# Patient Record
Sex: Male | Born: 1990 | Race: White | Hispanic: No | Marital: Married | Smoking: Current every day smoker
Health system: Southern US, Community
[De-identification: ages and names within clinical notes are randomized; demographics above are authoritative.]

---

## 2016-07-16 ENCOUNTER — Encounter: Payer: Self-pay | Admitting: Emergency Medicine

## 2016-07-16 ENCOUNTER — Emergency Department (INDEPENDENT_AMBULATORY_CARE_PROVIDER_SITE_OTHER)
Admission: EM | Admit: 2016-07-16 | Discharge: 2016-07-16 | Disposition: A | Discharge status: Home / Self Care | Source: Home / Self Care | Attending: Family Medicine | Admitting: Family Medicine

## 2016-07-16 ENCOUNTER — Other Ambulatory Visit: Payer: Self-pay

## 2016-07-16 ENCOUNTER — Emergency Department (INDEPENDENT_AMBULATORY_CARE_PROVIDER_SITE_OTHER)

## 2016-07-16 DIAGNOSIS — R079 Chest pain, unspecified: Secondary | ICD-10-CM

## 2016-07-16 DIAGNOSIS — R0602 Shortness of breath: Secondary | ICD-10-CM

## 2016-07-16 MED ORDER — PREDNISONE 20 MG PO TABS: ORAL_TABLET | ORAL | 0 refills | Status: AC

## 2016-07-16 MED ORDER — NAPROXEN 375 MG PO TABS: 375.0000 mg | ORAL_TABLET | Freq: Two times a day (BID) | ORAL | 0 refills | Status: AC

## 2016-07-16 NOTE — ED Provider Notes (Signed)
CSN: [id number]     Arrival date & time 07/16/16  1641 History   First MD Initiated Contact with Patient 07/16/16 1728     Chief Complaint  Patient presents with  . Chest Pain   (Consider location/radiation/quality/duration/timing/severity/associated sxs/prior Treatment) HPI  Allen Barnett is a 26 y.o. male presenting to UC with c/o centralized chest pressure and soreness for about 1 week. Mild intermittent SOB and lightheadedness. Intermittent heart burn, cough and wheeze for years per pt. Pt is a daily cigarette smoker.  Reports hx of exercise induced asthma as a child but notes he was accepted into the [employer] and did well.  Current pain is 2/10.  He takes Meloxicam routinely for muscle/joint pain but has not noticed a different in the chest pressure. Denies getting better or worse with lying down or eating. Denies nausea or vomiting. No abdominal pain.   History reviewed. No pertinent past medical history. History reviewed. No pertinent surgical history. No family history on file. Social History  Substance Use Topics  . Smoking status: Current Every Day Smoker    Packs/day: 1.00    Years: 15.00    Types: Cigarettes  . Smokeless tobacco: Never Used  . Alcohol use Not on file    Review of Systems  Constitutional: Negative for chills and fever.  HENT: Negative for congestion, ear pain, sore throat, trouble swallowing and voice change.   Respiratory: Positive for cough, shortness of breath and wheezing.   Cardiovascular: Positive for chest pain. Negative for palpitations.  Gastrointestinal: Negative for abdominal pain, diarrhea, nausea and vomiting.  Musculoskeletal: Negative for arthralgias, back pain and myalgias.  Skin: Negative for rash.    Allergies  Patient has no known allergies.  Home Medications   Prior to Admission medications   Medication Sig Start Date End Date Taking? Authorizing Provider  meloxicam (MOBIC) 15 MG tablet Take 15 mg by mouth daily.   Yes  Historical Provider, MD  naproxen (NAPROSYN) 375 MG tablet Take 1 tablet (375 mg total) by mouth 2 (two) times daily. 07/16/16   Junius Finner, PA-C  predniSONE (DELTASONE) 20 MG tablet 3 tabs po day one, then 2 po daily x 4 days 07/16/16   Junius Finner, PA-C   Meds Ordered and Administered this Visit  Medications - No data to display  BP 135/83 (BP Location: Left Arm)   Pulse 72   Temp 98.1 F (36.7 C) (Oral)   Ht 6' (1.829 m)   Wt 258 lb (117 kg)   SpO2 100%   BMI 34.99 kg/m  No data found.   Physical Exam  Constitutional: He is oriented to person, place, and time. He appears well-developed and well-nourished. No distress.  HENT:  Head: Normocephalic and atraumatic.  Nose: Nose normal.  Mouth/Throat: Uvula is midline, oropharynx is clear and moist and mucous membranes are normal.  Eyes: EOM are normal.  Neck: Normal range of motion. Neck supple.  Cardiovascular: Normal rate and regular rhythm.   Pulmonary/Chest: Effort normal and breath sounds normal. No respiratory distress. He has no wheezes. He has no rales. He exhibits no tenderness.  Musculoskeletal: Normal range of motion.  Neurological: He is alert and oriented to person, place, and time.  Skin: Skin is warm and dry. He is not diaphoretic.  Psychiatric: He has a normal mood and affect. His behavior is normal.  Nursing note and vitals reviewed.   Urgent Care Course     Procedures (including critical care time)  Labs Review Labs Reviewed - No  data to display  Imaging Review Dg Chest 2 View  Result Date: 07/16/2016 CLINICAL DATA:  Shortness of breath with chest tightness for 1 week EXAM: CHEST  2 VIEW COMPARISON:  None. FINDINGS: The heart size and mediastinal contours are within normal limits. Both lungs are clear. The visualized skeletal structures show degenerative change of the thoracic spine. IMPRESSION: No active cardiopulmonary disease. Electronically Signed   By: Alcide CleverMark  Lukens M.D.   On: 07/16/2016 17:47     Date/Time:07/16/2016     16:53:28 Ventricular Rate: 64 PR Interval: 130 QRS Duration: 92 QT Interval: 418 QTC Calculation: 431 P-R-T axes: -15   60   38 Text Interpretation: Normal sinus rhythm, Normal EKG    MDM   1. Chest pain, unspecified type    Pt c/o chest pain and pressure for 1 week. O2 Sat 100% on RA EKG and CXR: Normal  Reassured pt of normal workup.  Pain could be due to costochondritis vs mild GERD May try OTC Prilosec Rx: Prednisone and Naproxen (instead of Meloxicam) for possible costochondritis   f/u with PCP/Sports Medicine in 1 week if not improving.     Junius Finnerrin O'Malley, PA-C 07/16/16 1849

## 2016-07-16 NOTE — ED Triage Notes (Signed)
Chest pressure, center of chest, SOB, light headed x 1 week. Heartburn, cough, wheezing (for years)

## 2017-07-18 IMAGING — DX DG CHEST 2V
2 series · 2 of 2 positions shown · non-contrast
Comparison: None.

CLINICAL DATA: Shortness of breath with chest tightness for 1 week

EXAM:
CHEST  2 VIEW

[chest pa]
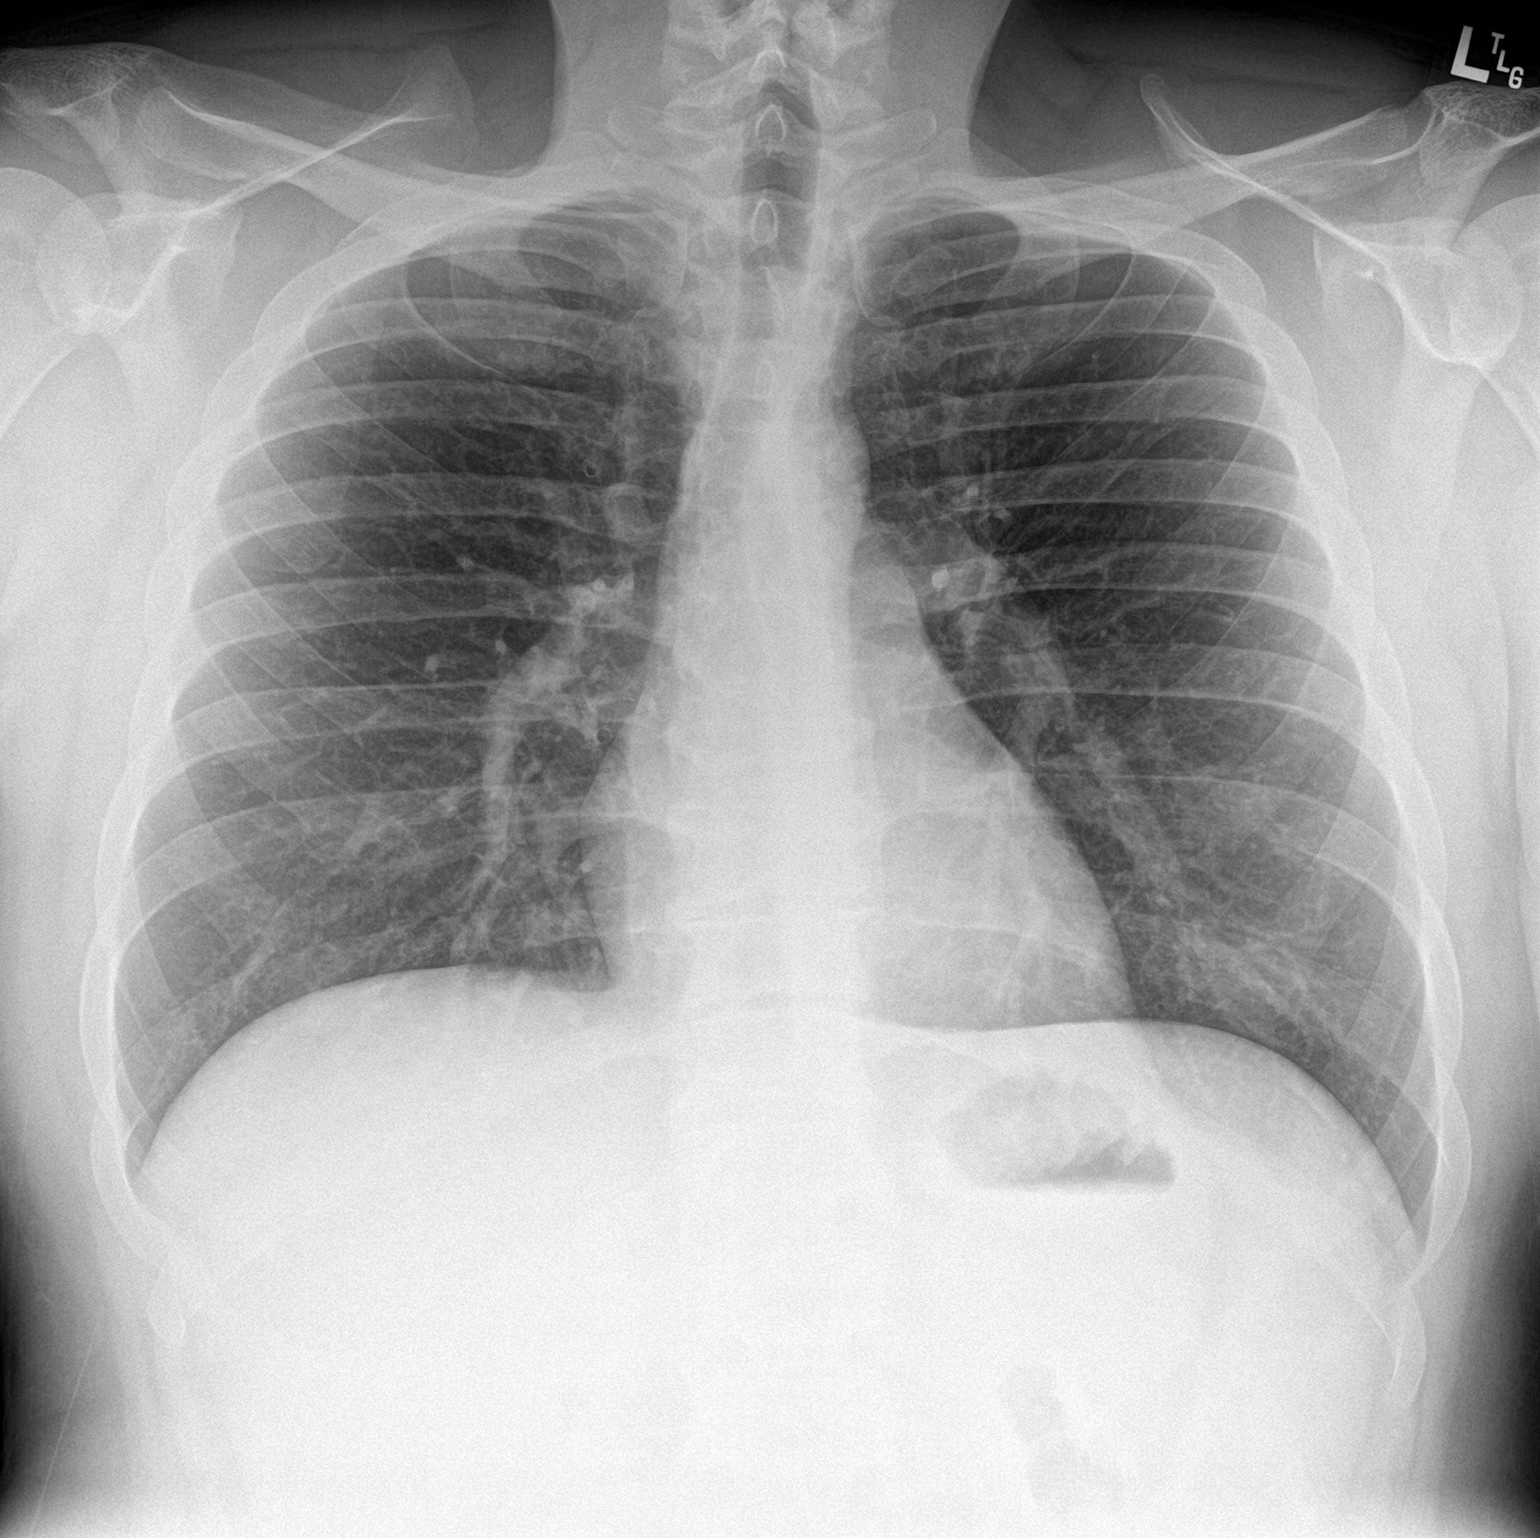

[chest lat]
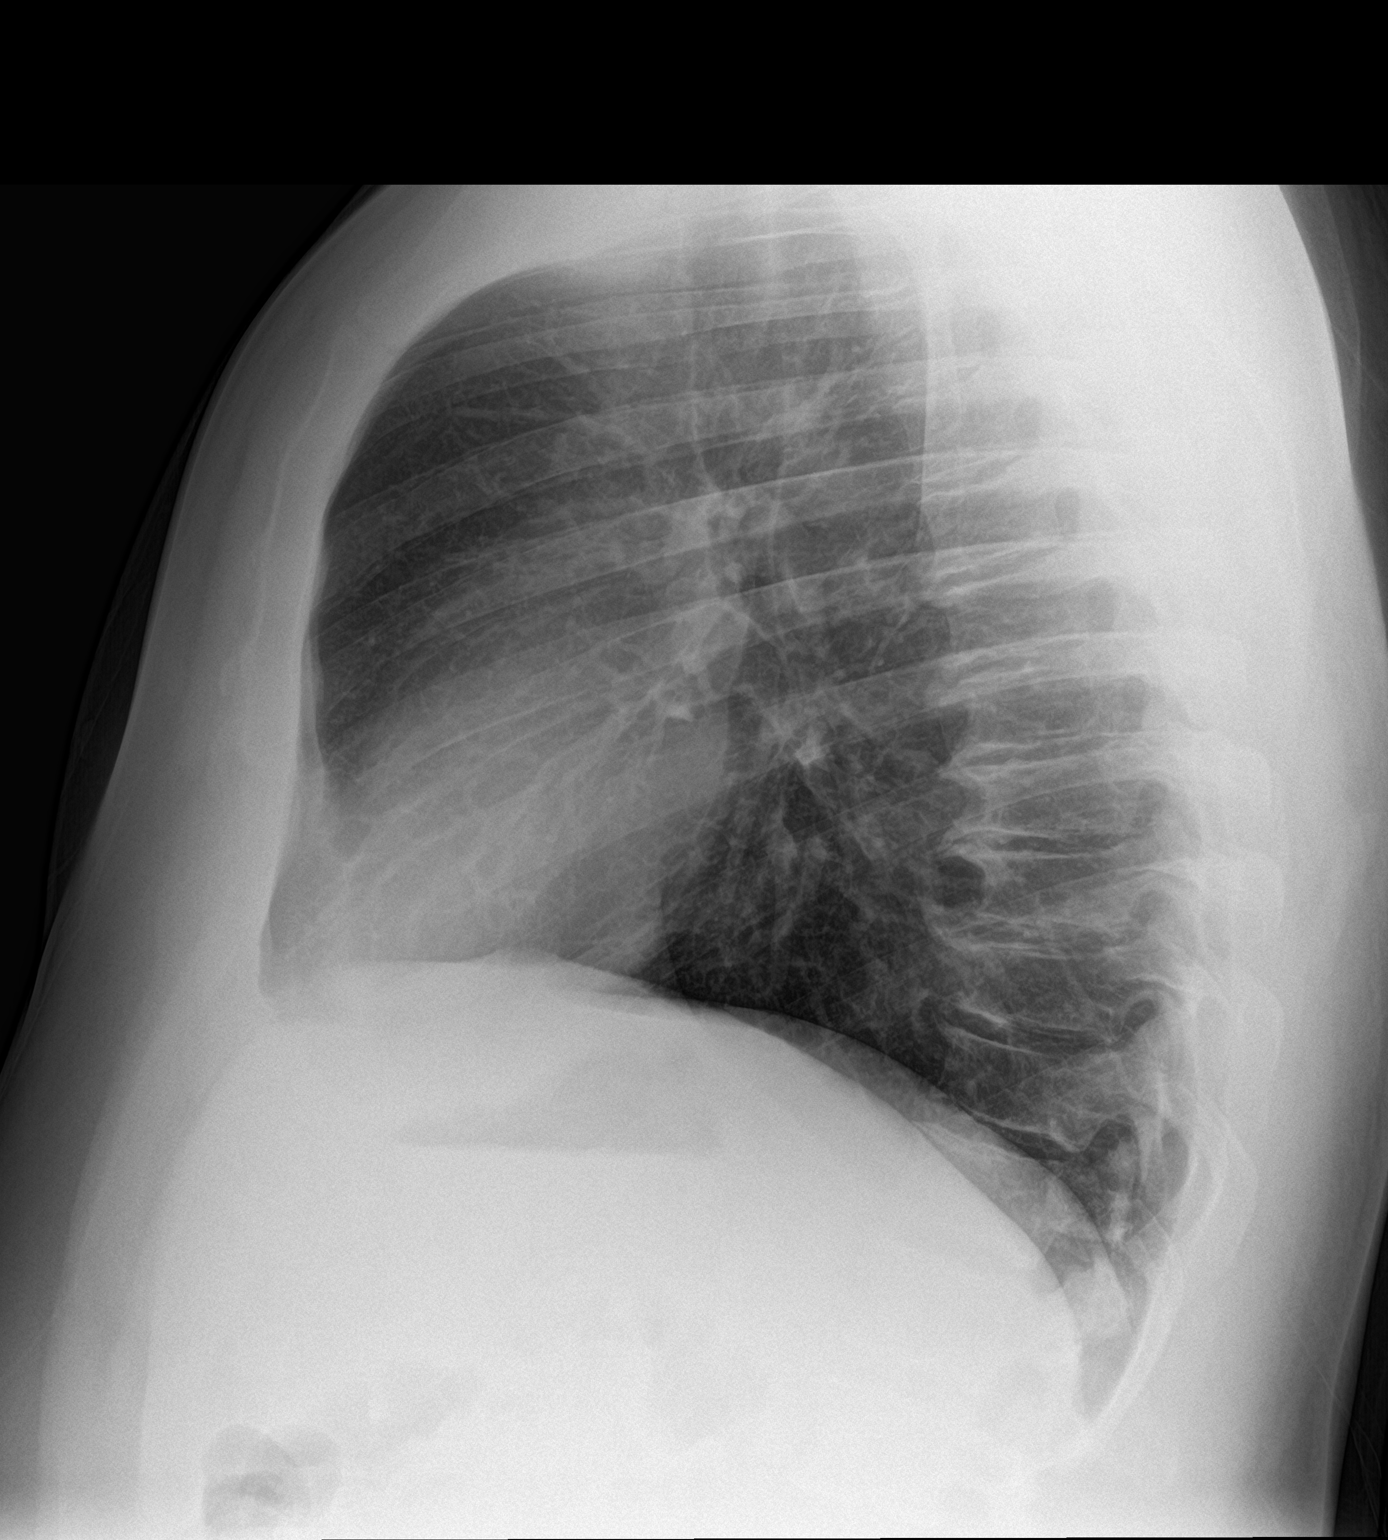

[2 of 2 positions shown; findings below may reference images not displayed]

FINDINGS: The heart size and mediastinal contours are within normal limits.
Both lungs are clear. The visualized skeletal structures show
degenerative change of the thoracic spine.
IMPRESSION: No active cardiopulmonary disease.

## 2022-12-03 ENCOUNTER — Other Ambulatory Visit (HOSPITAL_COMMUNITY): Payer: Self-pay | Admitting: Family Medicine

## 2022-12-03 DIAGNOSIS — R002 Palpitations: Secondary | ICD-10-CM

## 2022-12-03 DIAGNOSIS — R0600 Dyspnea, unspecified: Secondary | ICD-10-CM

## 2022-12-03 DIAGNOSIS — R0789 Other chest pain: Secondary | ICD-10-CM

## 2022-12-17 ENCOUNTER — Ambulatory Visit (HOSPITAL_COMMUNITY): Attending: Family Medicine

## 2022-12-17 DIAGNOSIS — R0789 Other chest pain: Secondary | ICD-10-CM

## 2022-12-17 DIAGNOSIS — R002 Palpitations: Secondary | ICD-10-CM

## 2022-12-17 DIAGNOSIS — R0600 Dyspnea, unspecified: Secondary | ICD-10-CM

## 2022-12-17 LAB — ECHOCARDIOGRAM COMPLETE
Area-P 1/2: 2.77 cm2
S' Lateral: 3.3 cm
